# Patient Record
Sex: Male | Born: 1990 | Race: Black or African American | Hispanic: No | Marital: Single | Smoking: Never smoker
Health system: Southern US, Community
[De-identification: ages and names within clinical notes are randomized; demographics above are authoritative.]

---
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
## 2017-01-31 HISTORY — PX: HIP FRACTURE SURGERY: SHX118

## 2017-09-27 ENCOUNTER — Other Ambulatory Visit: Payer: Self-pay

## 2017-09-27 ENCOUNTER — Emergency Department: Payer: No Typology Code available for payment source

## 2017-09-27 ENCOUNTER — Encounter: Payer: Self-pay | Admitting: *Deleted

## 2017-09-27 ENCOUNTER — Emergency Department
Admission: EM | Admit: 2017-09-27 | Discharge: 2017-09-28 | Disposition: A | Discharge status: Other Acute Inpatient Hospital | Payer: No Typology Code available for payment source | Attending: Emergency Medicine | Admitting: Emergency Medicine

## 2017-09-27 DIAGNOSIS — M79605 Pain in left leg: Secondary | ICD-10-CM | POA: Diagnosis present

## 2017-09-27 DIAGNOSIS — Z23 Encounter for immunization: Secondary | ICD-10-CM | POA: Insufficient documentation

## 2017-09-27 DIAGNOSIS — M25552 Pain in left hip: Secondary | ICD-10-CM | POA: Diagnosis not present

## 2017-09-27 DIAGNOSIS — S32402A Unspecified fracture of left acetabulum, initial encounter for closed fracture: Secondary | ICD-10-CM

## 2017-09-27 MED ORDER — TETANUS-DIPHTH-ACELL PERTUSSIS 5-2.5-18.5 LF-MCG/0.5 IM SUSP
0.5000 mL | Freq: Once | INTRAMUSCULAR | Status: AC
  Administered 2017-09-27: 0.5 mL via INTRAMUSCULAR
  Filled 2017-09-27: qty 0.5

## 2017-09-27 NOTE — ED Notes (Signed)
ED Provider at bedside. 

## 2017-09-27 NOTE — ED Notes (Signed)
Patient transported to CT 

## 2017-09-27 NOTE — ED Notes (Signed)
Patient transported to X-ray 
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
## 2017-09-27 NOTE — ED Notes (Signed)
Charge RN notified pt needs bed on acute side for further testing
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
## 2017-09-28 MED ORDER — LIDOCAINE HCL (PF) 1 % IJ SOLN
0.50 | INTRAMUSCULAR | Status: DC
Start: ? — End: 2017-09-28

## 2017-09-28 MED ORDER — ONDANSETRON HCL 4 MG/2ML IJ SOLN
4.00 | INTRAMUSCULAR | Status: DC
Start: ? — End: 2017-09-28
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
## 2017-09-28 NOTE — ED Notes (Signed)
[facility] and Medical Necessity documentation reviewed at this time and found to be complete per policy. 

## 2017-09-28 NOTE — Discharge Instructions (Addendum)
Dr. Victory Dakiniley

## 2017-09-29 MED ORDER — POLYETHYLENE GLYCOL 3350 17 G PO PACK
17.00 | PACK | ORAL | Status: DC
Start: 2017-09-30 — End: 2017-09-29

## 2017-09-29 MED ORDER — ONDANSETRON HCL 4 MG/2ML IJ SOLN
4.00 | INTRAMUSCULAR | Status: DC
Start: ? — End: 2017-09-29

## 2017-09-29 MED ORDER — LACTATED RINGERS IV SOLN
INTRAVENOUS | Status: DC
Start: ? — End: 2017-09-29

## 2017-09-29 MED ORDER — MAGNESIUM HYDROXIDE 400 MG/5ML PO SUSP
30.00 | ORAL | Status: DC
Start: ? — End: 2017-09-29

## 2017-09-29 MED ORDER — ACETAMINOPHEN 325 MG PO TABS
975.00 | ORAL_TABLET | ORAL | Status: DC
Start: 2017-09-30 — End: 2017-09-29

## 2017-09-29 MED ORDER — PROMETHAZINE HCL 25 MG PO TABS
25.00 | ORAL_TABLET | ORAL | Status: DC
Start: ? — End: 2017-09-29

## 2017-09-29 MED ORDER — GABAPENTIN 100 MG PO CAPS
100.00 | ORAL_CAPSULE | ORAL | Status: DC
Start: 2017-09-29 — End: 2017-09-29

## 2017-09-29 MED ORDER — SENNOSIDES-DOCUSATE SODIUM 8.6-50 MG PO TABS
2.00 | ORAL_TABLET | ORAL | Status: DC
Start: 2017-09-30 — End: 2017-09-29

## 2017-09-29 MED ORDER — OXYCODONE HCL 5 MG PO TABS
5.00 | ORAL_TABLET | ORAL | Status: DC
Start: ? — End: 2017-09-29

## 2017-09-29 MED ORDER — ENOXAPARIN SODIUM 40 MG/0.4ML ~~LOC~~ SOLN
40.00 | SUBCUTANEOUS | Status: DC
Start: 2017-09-30 — End: 2017-09-29

## 2018-07-26 ENCOUNTER — Other Ambulatory Visit (HOSPITAL_COMMUNITY): Payer: Self-pay | Admitting: Family Medicine

## 2018-07-31 LAB — GONOCOCCUS CULTURE

## 2019-01-07 ENCOUNTER — Other Ambulatory Visit: Payer: Self-pay

## 2019-01-07 ENCOUNTER — Ambulatory Visit: Payer: Self-pay | Admitting: Physician Assistant

## 2019-01-07 DIAGNOSIS — Z113 Encounter for screening for infections with a predominantly sexual mode of transmission: Secondary | ICD-10-CM

## 2019-01-07 LAB — GRAM STAIN

## 2019-01-07 NOTE — Progress Notes (Signed)
Gram stain reviewed and is negative today, so no treatment needed for gram stain per standing order. Provider orders completed.Delio Slates, RN 

## 2019-01-08 ENCOUNTER — Encounter: Payer: Self-pay | Admitting: Physician Assistant

## 2019-01-08 NOTE — Progress Notes (Signed)
    STI clinic/screening visit  Subjective:  Sean [name] is a 28 y.o. male being seen today for an STI screening visit. The patient reports they do not have symptoms.      Patient has the following medical conditions:  There are no active problems to display for this patient.    Chief Complaint  Patient presents with  . SEXUALLY TRANSMITTED DISEASE    HPI  Patient reports that he is not having any symptoms but would like a screening today.  Denies any chronic conditions or regular medications.  See flowsheet for further details and programmatic requirements.    The following portions of the patient's history were reviewed and updated as appropriate: allergies, current medications, past medical history, past social history, past surgical history and problem list.  Objective:  There were no vitals filed for this visit.  Physical Exam Constitutional:      General: He is not in acute distress.    Appearance: Normal appearance.  HENT:     Head: Normocephalic and atraumatic.     Comments: No nits, lice, or hair loss. No cervical, supraclavicular or axillary adenopathy.    Mouth/Throat:     Mouth: Mucous membranes are moist.     Pharynx: Oropharynx is clear. No oropharyngeal exudate or posterior oropharyngeal erythema.  Eyes:     Conjunctiva/sclera: Conjunctivae normal.  Neck:     Musculoskeletal: Neck supple. No muscular tenderness.  Pulmonary:     Effort: Pulmonary effort is normal.  Abdominal:     Palpations: There is no mass.     Tenderness: There is no abdominal tenderness. There is no guarding or rebound.  Genitourinary:    Penis: Normal.      Scrotum/Testes: Normal.     Comments: Pubic area without nits, lice, edema, erythema, lesions or inguinal adenopathy. Penis circumcised and without discharge at meatus. Skin:    General: Skin is warm and dry.     Findings: No bruising, erythema, lesion or rash.  Neurological:     Mental Status: He is alert and oriented to  person, place, and time.  Psychiatric:        Mood and Affect: Mood normal.        Behavior: Behavior normal.        Thought Content: Thought content normal.        Judgment: Judgment normal.       Assessment and Plan:  Sean [name] is a 28 y.o. male presenting to the [facility] Department for STI screening  1. Screening for STD (sexually transmitted disease) Patient into clinic without symptoms. Rec condoms with all sex. Await test results.  Counseled that RN will call if needs to RTC for treatment once results are back.  - Gram stain - Gonococcus culture - HIV [facility] - Syphilis Serology, [facility] - Gonococcus culture     No follow-ups on file.  No future appointments.  Jerene Dilling, PA

## 2019-01-12 LAB — GONOCOCCUS CULTURE

## 2019-01-14 LAB — GONOCOCCUS CULTURE

## 2019-01-21 ENCOUNTER — Telehealth: Payer: Self-pay

## 2019-01-22 NOTE — Telephone Encounter (Signed)
RN comments/Mychart message seen by patient on 01/22/19. Closing to f/u Aileen Fass, RN
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
